# Patient Record
Sex: Female | Born: 1960 | Race: White | Hispanic: No | Marital: Married | Smoking: Never smoker
Health system: Southern US, Community
[De-identification: ages and names within clinical notes are randomized; demographics above are authoritative.]

## PROBLEM LIST (undated history)

## (undated) DIAGNOSIS — Z973 Presence of spectacles and contact lenses: Secondary | ICD-10-CM

## (undated) DIAGNOSIS — Z978 Presence of other specified devices: Secondary | ICD-10-CM

## (undated) DIAGNOSIS — Z960 Presence of urogenital implants: Secondary | ICD-10-CM

## (undated) DIAGNOSIS — N35919 Unspecified urethral stricture, male, unspecified site: Secondary | ICD-10-CM

## (undated) DIAGNOSIS — R310 Gross hematuria: Secondary | ICD-10-CM

## (undated) HISTORY — PX: TUBAL LIGATION: SHX77

## (undated) HISTORY — PX: LAPAROSCOPIC ASSISTED VAGINAL HYSTERECTOMY: SHX5398

## (undated) HISTORY — PX: AUGMENTATION MAMMAPLASTY: SUR837

---

## 1999-09-04 ENCOUNTER — Encounter: Admission: RE | Admit: 1999-09-04 | Discharge: 1999-09-04 | Payer: Self-pay | Admitting: Obstetrics and Gynecology

## 1999-09-04 ENCOUNTER — Encounter: Payer: Self-pay | Admitting: Obstetrics and Gynecology

## 2000-09-11 ENCOUNTER — Encounter: Payer: Self-pay | Admitting: Obstetrics and Gynecology

## 2000-09-11 ENCOUNTER — Encounter: Admission: RE | Admit: 2000-09-11 | Discharge: 2000-09-11 | Payer: Self-pay | Admitting: Obstetrics and Gynecology

## 2001-06-03 HISTORY — PX: BREAST ENHANCEMENT SURGERY: SHX7

## 2001-10-02 ENCOUNTER — Encounter: Admission: RE | Admit: 2001-10-02 | Discharge: 2001-10-02 | Payer: Self-pay | Admitting: Obstetrics and Gynecology

## 2001-10-02 ENCOUNTER — Encounter: Payer: Self-pay | Admitting: Obstetrics and Gynecology

## 2002-10-22 ENCOUNTER — Encounter: Admission: RE | Admit: 2002-10-22 | Discharge: 2002-10-22 | Payer: Self-pay | Admitting: Obstetrics and Gynecology

## 2002-10-22 ENCOUNTER — Encounter: Payer: Self-pay | Admitting: Obstetrics and Gynecology

## 2003-11-02 ENCOUNTER — Encounter: Admission: RE | Admit: 2003-11-02 | Discharge: 2003-11-02 | Payer: Self-pay | Admitting: Obstetrics and Gynecology

## 2004-04-12 ENCOUNTER — Encounter: Admission: RE | Admit: 2004-04-12 | Discharge: 2004-04-12 | Payer: Self-pay | Admitting: Internal Medicine

## 2004-11-07 ENCOUNTER — Encounter: Admission: RE | Admit: 2004-11-07 | Discharge: 2004-11-07 | Payer: Self-pay | Admitting: Obstetrics and Gynecology

## 2005-11-15 ENCOUNTER — Encounter: Admission: RE | Admit: 2005-11-15 | Discharge: 2005-11-15 | Payer: Self-pay | Admitting: Obstetrics and Gynecology

## 2006-11-17 ENCOUNTER — Encounter: Admission: RE | Admit: 2006-11-17 | Discharge: 2006-11-17 | Payer: Self-pay | Admitting: Obstetrics and Gynecology

## 2007-12-01 ENCOUNTER — Encounter: Admission: RE | Admit: 2007-12-01 | Discharge: 2007-12-01 | Payer: Self-pay | Admitting: Obstetrics and Gynecology

## 2008-12-01 ENCOUNTER — Encounter: Admission: RE | Admit: 2008-12-01 | Discharge: 2008-12-01 | Payer: Self-pay | Admitting: Obstetrics and Gynecology

## 2009-04-03 ENCOUNTER — Ambulatory Visit (HOSPITAL_COMMUNITY): Admission: RE | Admit: 2009-04-03 | Discharge: 2009-04-04 | Payer: Self-pay | Admitting: Obstetrics and Gynecology

## 2009-04-03 ENCOUNTER — Encounter (HOSPITAL_COMMUNITY): Payer: Self-pay | Admitting: Obstetrics and Gynecology

## 2009-12-19 ENCOUNTER — Encounter: Admission: RE | Admit: 2009-12-19 | Discharge: 2009-12-19 | Payer: Self-pay | Admitting: Obstetrics and Gynecology

## 2010-06-24 ENCOUNTER — Encounter: Payer: Self-pay | Admitting: Obstetrics and Gynecology

## 2010-09-05 LAB — CBC
Hemoglobin: 8.4 g/dL — ABNORMAL LOW (ref 12.0–15.0)
MCV: 91 fL (ref 78.0–100.0)
Platelets: 123 10*3/uL — ABNORMAL LOW (ref 150–400)
RBC: 2.68 MIL/uL — ABNORMAL LOW (ref 3.87–5.11)
RDW: 13.2 % (ref 11.5–15.5)

## 2010-09-06 LAB — COMPREHENSIVE METABOLIC PANEL
ALT: 12 U/L (ref 0–35)
Alkaline Phosphatase: 49 U/L (ref 39–117)
BUN: 5 mg/dL — ABNORMAL LOW (ref 6–23)
CO2: 26 mEq/L (ref 19–32)
Chloride: 104 mEq/L (ref 96–112)
GFR calc non Af Amer: >60 mL/min (ref >60)
Glucose, Bld: 84 mg/dL (ref 70–99)
Potassium: 3.6 mEq/L (ref 3.5–5.1)
Sodium: 138 mEq/L (ref 135–145)

## 2010-09-06 LAB — CBC
Hemoglobin: 13.4 g/dL (ref 12.0–15.0)
MCHC: 34 g/dL (ref 30.0–36.0)
MCV: 90.8 fL (ref 78.0–100.0)
Platelets: 213 10*3/uL (ref 150–400)
WBC: 5.4 10*3/uL (ref 4.0–10.5)

## 2010-09-06 LAB — TYPE AND SCREEN
ABO/RH(D): O POS
Antibody Screen: NEGATIVE

## 2010-09-06 LAB — ABO/RH: ABO/RH(D): O POS

## 2010-11-21 ENCOUNTER — Other Ambulatory Visit: Payer: Self-pay | Admitting: Obstetrics and Gynecology

## 2010-11-21 DIAGNOSIS — Z1231 Encounter for screening mammogram for malignant neoplasm of breast: Secondary | ICD-10-CM

## 2010-12-25 ENCOUNTER — Ambulatory Visit
Admission: RE | Admit: 2010-12-25 | Discharge: 2010-12-25 | Disposition: A | Discharge status: Home / Self Care | Payer: 59 | Source: Ambulatory Visit | Attending: Obstetrics and Gynecology | Admitting: Obstetrics and Gynecology

## 2010-12-25 DIAGNOSIS — Z1231 Encounter for screening mammogram for malignant neoplasm of breast: Secondary | ICD-10-CM

## 2010-12-28 ENCOUNTER — Other Ambulatory Visit: Payer: Self-pay | Admitting: Obstetrics and Gynecology

## 2010-12-28 DIAGNOSIS — R928 Other abnormal and inconclusive findings on diagnostic imaging of breast: Secondary | ICD-10-CM

## 2011-01-02 ENCOUNTER — Ambulatory Visit
Admission: RE | Admit: 2011-01-02 | Discharge: 2011-01-02 | Disposition: A | Discharge status: Home / Self Care | Payer: 59 | Source: Ambulatory Visit | Attending: Obstetrics and Gynecology | Admitting: Obstetrics and Gynecology

## 2011-01-02 DIAGNOSIS — R928 Other abnormal and inconclusive findings on diagnostic imaging of breast: Secondary | ICD-10-CM

## 2011-04-11 ENCOUNTER — Other Ambulatory Visit: Payer: Self-pay | Admitting: Gastroenterology

## 2011-12-24 ENCOUNTER — Other Ambulatory Visit: Payer: Self-pay | Admitting: Obstetrics and Gynecology

## 2011-12-24 DIAGNOSIS — Z1231 Encounter for screening mammogram for malignant neoplasm of breast: Secondary | ICD-10-CM

## 2011-12-24 DIAGNOSIS — Z9882 Breast implant status: Secondary | ICD-10-CM

## 2012-01-10 ENCOUNTER — Ambulatory Visit
Admission: RE | Admit: 2012-01-10 | Discharge: 2012-01-10 | Disposition: A | Discharge status: Home / Self Care | Payer: 59 | Source: Ambulatory Visit | Attending: Obstetrics and Gynecology | Admitting: Obstetrics and Gynecology

## 2012-01-10 DIAGNOSIS — Z1231 Encounter for screening mammogram for malignant neoplasm of breast: Secondary | ICD-10-CM

## 2012-01-10 DIAGNOSIS — Z9882 Breast implant status: Secondary | ICD-10-CM

## 2012-04-21 ENCOUNTER — Other Ambulatory Visit: Payer: Self-pay | Admitting: Urology

## 2012-04-22 ENCOUNTER — Encounter (HOSPITAL_BASED_OUTPATIENT_CLINIC_OR_DEPARTMENT_OTHER): Payer: Self-pay | Admitting: *Deleted

## 2012-04-22 NOTE — Progress Notes (Signed)
NPO AFTER MN. ARRIVES AT 0745. NEEDS HG. 

## 2012-04-27 NOTE — H&P (Signed)
History of Present Illness          F/u microscopic hematuria referred by Dr. Zelphia Cairo November 2013. The patient was seen for her annual GYN exam August 2013 when a UA showed 1+ blood in the urine culture was negative. She has had no gross hematuria. She had had rare UTI. No recent dysuria.   She voids with a good stream and feels empty. No has no bothersome lower urinary tract symptoms. She has no urgency. No GU surgery. She had TV Hx in 2010 for firboids. No incontinence or pad use.   She has no smoking history. No exposure to chemotherapy or XRT. No chemical, dye, solvent exposure. She has no history of stones.   Interval Hx She returns for CT, exam and cystoscopy.   Unofficially, we discussed her CT scan looks normal.   Past Medical History Problems  1. History of  No Medical Problems  Surgical History Problems  1. History of  Breast Surgery 2. History of  Hysterectomy V45.77 3. History of  Tubal Ligation V25.2  Current Meds 1. Aspirin 81 MG Oral Tablet; Therapy: (Recorded:05Nov2013) to 2. Biotin POWD; Therapy: (Recorded:05Nov2013) to 3. Calcium TABS; Therapy: (Recorded:05Nov2013) to 4. Multi-Vitamin TABS; Therapy: (Recorded:05Nov2013) to  Allergies Medication  1. No Known Drug Allergies  Family History Problems  1. Family history of  Death In The Family Mother 2. Family history of  Family Health Status Number Of Children 1 daughter 3. Family history of  Family Health Status Of Father - Alive 4. Paternal history of  Nephrolithiasis  Social History Problems  1. Alcohol Use occasionally 2. Caffeine Use 2 per day 3. Former Smoker V15.82 4. Occupation: provider claim spec  Review of Systems constitutional Amended By: Jerilee Field; 04/20/2012 4:28 PMEST, cardiovascular Amended By: Jerilee Field; 04/20/2012 4:28 PMEST, pulmonary Amended By: Jerilee Field; 04/20/2012 4:28 PMEST, gastrointestinal Amended By: Jerilee Field; 04/20/2012 4:28  PMEST and neurological Amended By: Jerilee Field; 04/20/2012 4:28 PMEST system(s) were reviewed and pertinent findings if present are noted.    Vitals Vital Signs [Data Includes: Last 1 Day]  18Nov2013 03:25PM  Blood Pressure: 152 / 82 Heart Rate: 76  Physical Exam Constitutional: Well nourished and well developed . No acute distress.  ENT:. The ears and nose are normal in appearance.  Neck: The appearance of the neck is normal and no neck mass is present.  Pulmonary: No respiratory distress and normal respiratory rhythm and effort.  Cardiovascular: Heart rate and rhythm are normal . No peripheral edema.  Abdomen: The abdomen is soft and nontender. No masses are palpated. No CVA tenderness. No hernias are palpable. No hepatosplenomegaly noted.  Genitourinary:  Chaperone Present: .  Examination of the external genitalia shows normal female external genitalia and no lesions. The urethra is normal in appearance and not tender. There is no urethral mass. Vaginal exam demonstrates no abnormalities. The bladder is non tender, not distended and without masses. The anus is normal on inspection. The perineum is normal on inspection.  Lymphatics: The femoral and inguinal nodes are not enlarged or tender.  Skin: Normal skin turgor, no visible rash and no visible skin lesions.  Neuro/Psych:. Mood and affect are appropriate.    Procedure     Patient was placed in lithotomy and prepped and draped in the usual fashion. Alcario Drought was the chaperone.  I could not insert the cystoscope therefore I attempted to dilate the patient with a 39 [place] and a 14 [place] dilator but neither of these would pass  either. The patient was experiencing some discomfort. Therefore the procedure was aborted.     Assessment Assessed  1. Microscopic Hematuria 599.72  Plan Microscopic Hematuria (599.72)  1. AU CT-HEMATURIA PROTOCOL  Done: 18Nov2013 12:00AM 2. Follow-up Schedule Surgery Office  Follow-up  Done:  18Nov2013  Discussion/Summary     Discussed with patient and husband the nature risks benefits and alternatives to cystoscopy possible urethral dilation and possible bladder biopsy in the OR. All questions answered and they elect to proceed. She may simply be tense here in the office as she was quite nervous and not allowing any of the dilators or scope to pass.     Signatures Electronically signed by : Jerilee Field, M.D.; Apr 20 2012  4:28PM

## 2012-04-28 ENCOUNTER — Encounter (HOSPITAL_BASED_OUTPATIENT_CLINIC_OR_DEPARTMENT_OTHER): Payer: Self-pay | Admitting: *Deleted

## 2012-04-28 ENCOUNTER — Encounter (HOSPITAL_BASED_OUTPATIENT_CLINIC_OR_DEPARTMENT_OTHER)
Admission: RE | Disposition: A | Discharge status: Home / Self Care | Payer: Self-pay | Source: Ambulatory Visit | Attending: Urology

## 2012-04-28 ENCOUNTER — Encounter (HOSPITAL_BASED_OUTPATIENT_CLINIC_OR_DEPARTMENT_OTHER): Payer: Self-pay | Admitting: Anesthesiology

## 2012-04-28 ENCOUNTER — Ambulatory Visit (HOSPITAL_BASED_OUTPATIENT_CLINIC_OR_DEPARTMENT_OTHER): Payer: 59 | Admitting: Anesthesiology

## 2012-04-28 ENCOUNTER — Ambulatory Visit (HOSPITAL_BASED_OUTPATIENT_CLINIC_OR_DEPARTMENT_OTHER)
Admission: RE | Admit: 2012-04-28 | Discharge: 2012-04-28 | Disposition: A | Discharge status: Home / Self Care | Payer: 59 | Source: Ambulatory Visit | Attending: Urology | Admitting: Urology

## 2012-04-28 DIAGNOSIS — Z87891 Personal history of nicotine dependence: Secondary | ICD-10-CM | POA: Insufficient documentation

## 2012-04-28 DIAGNOSIS — R3129 Other microscopic hematuria: Secondary | ICD-10-CM | POA: Insufficient documentation

## 2012-04-28 DIAGNOSIS — Z90710 Acquired absence of both cervix and uterus: Secondary | ICD-10-CM | POA: Insufficient documentation

## 2012-04-28 DIAGNOSIS — Z7982 Long term (current) use of aspirin: Secondary | ICD-10-CM | POA: Insufficient documentation

## 2012-04-28 DIAGNOSIS — N35919 Unspecified urethral stricture, male, unspecified site: Secondary | ICD-10-CM | POA: Insufficient documentation

## 2012-04-28 HISTORY — PX: CYSTOSCOPY WITH BIOPSY: SHX5122

## 2012-04-28 HISTORY — PX: CYSTOSCOPY WITH URETHRAL DILATATION: SHX5125

## 2012-04-28 LAB — POCT HEMOGLOBIN-HEMACUE: Hemoglobin: 14.1 g/dL (ref 12.0–15.0)

## 2012-04-28 SURGERY — CYSTOSCOPY, WITH URETHRAL DILATION
Anesthesia: General | Site: Urethra | Wound class: Clean Contaminated

## 2012-04-28 MED ORDER — IOHEXOL 300 MG/ML  SOLN
INTRAMUSCULAR | Status: DC | PRN
  Administered 2012-04-28: 1 mL

## 2012-04-28 MED ORDER — CEFAZOLIN SODIUM-DEXTROSE 2-3 GM-% IV SOLR
2.0000 g | INTRAVENOUS | Status: DC
  Filled 2012-04-28: qty 50

## 2012-04-28 MED ORDER — LACTATED RINGERS IV SOLN
INTRAVENOUS | Status: DC | PRN
  Administered 2012-04-28 (×2): via INTRAVENOUS

## 2012-04-28 MED ORDER — ONDANSETRON HCL 4 MG/2ML IJ SOLN
INTRAMUSCULAR | Status: DC | PRN
  Administered 2012-04-28: 4 mg via INTRAVENOUS

## 2012-04-28 MED ORDER — PROMETHAZINE HCL 25 MG/ML IJ SOLN
6.2500 mg | INTRAMUSCULAR | Status: DC | PRN
  Filled 2012-04-28: qty 1

## 2012-04-28 MED ORDER — DEXAMETHASONE SODIUM PHOSPHATE 4 MG/ML IJ SOLN
INTRAMUSCULAR | Status: DC | PRN
  Administered 2012-04-28: 10 mg via INTRAVENOUS

## 2012-04-28 MED ORDER — CEFAZOLIN SODIUM-DEXTROSE 2-3 GM-% IV SOLR
INTRAVENOUS | Status: DC | PRN
  Administered 2012-04-28: 2 g via INTRAVENOUS

## 2012-04-28 MED ORDER — LACTATED RINGERS IV SOLN
INTRAVENOUS | Status: DC
  Administered 2012-04-28: 100 mL/h via INTRAVENOUS
  Filled 2012-04-28: qty 1000

## 2012-04-28 MED ORDER — MIDAZOLAM HCL 5 MG/5ML IJ SOLN
INTRAMUSCULAR | Status: DC | PRN
  Administered 2012-04-28: 2 mg via INTRAVENOUS

## 2012-04-28 MED ORDER — PROPOFOL 10 MG/ML IV BOLUS
INTRAVENOUS | Status: DC | PRN
  Administered 2012-04-28: 170 mg via INTRAVENOUS

## 2012-04-28 MED ORDER — FENTANYL CITRATE 0.05 MG/ML IJ SOLN
INTRAMUSCULAR | Status: DC | PRN
  Administered 2012-04-28 (×2): 25 ug via INTRAVENOUS
  Administered 2012-04-28: 50 ug via INTRAVENOUS

## 2012-04-28 MED ORDER — STERILE WATER FOR IRRIGATION IR SOLN
Status: DC | PRN
  Administered 2012-04-28: 1500 mL

## 2012-04-28 MED ORDER — LIDOCAINE HCL (CARDIAC) 20 MG/ML IV SOLN
INTRAVENOUS | Status: DC | PRN
  Administered 2012-04-28: 75 mg via INTRAVENOUS

## 2012-04-28 MED ORDER — FENTANYL CITRATE 0.05 MG/ML IJ SOLN
25.0000 ug | INTRAMUSCULAR | Status: DC | PRN
  Filled 2012-04-28: qty 1

## 2012-04-28 MED ORDER — CEFAZOLIN SODIUM 1-5 GM-% IV SOLN
1.0000 g | INTRAVENOUS | Status: DC
  Filled 2012-04-28: qty 50

## 2012-04-28 MED ORDER — KETOROLAC TROMETHAMINE 30 MG/ML IJ SOLN
INTRAMUSCULAR | Status: DC | PRN
  Administered 2012-04-28: 30 mg via INTRAVENOUS

## 2012-04-28 MED ORDER — URIBEL 118 MG PO CAPS: 1.0000 | ORAL_CAPSULE | Freq: Three times a day (TID) | ORAL | Status: AC | PRN

## 2012-04-28 MED ORDER — LACTATED RINGERS IV SOLN
INTRAVENOUS | Status: DC
  Filled 2012-04-28: qty 1000

## 2012-04-28 SURGICAL SUPPLY — 14 items
BAG DRAIN URO-CYSTO SKYTR STRL (DRAIN) ×2 IMPLANT
CANISTER SUCT LVC 12 LTR MEDI- (MISCELLANEOUS) ×2 IMPLANT
CLOTH BEACON ORANGE TIMEOUT ST (SAFETY) ×2 IMPLANT
DRAPE CAMERA CLOSED 9X96 (DRAPES) ×2 IMPLANT
ELECT REM PT RETURN 9FT ADLT (ELECTROSURGICAL) ×2
ELECTRODE REM PT RTRN 9FT ADLT (ELECTROSURGICAL) ×1 IMPLANT
GLOVE BIO SURGEON STRL SZ7.5 (GLOVE) ×2 IMPLANT
GLOVE BIOGEL PI IND STRL 6.5 (GLOVE) ×2 IMPLANT
GLOVE BIOGEL PI INDICATOR 6.5 (GLOVE) ×2
GOWN STRL REIN XL XLG (GOWN DISPOSABLE) ×2 IMPLANT
NEEDLE HYPO 22GX1.5 SAFETY (NEEDLE) IMPLANT
NS IRRIG 500ML POUR BTL (IV SOLUTION) IMPLANT
PACK CYSTOSCOPY (CUSTOM PROCEDURE TRAY) ×2 IMPLANT
WATER STERILE IRR 3000ML UROMA (IV SOLUTION) ×2 IMPLANT

## 2012-04-28 NOTE — Transfer of Care (Signed)
Immediate Anesthesia Transfer of Care Note  Patient: Anna Livingston  Procedure(s) Performed: Procedure(s) (LRB): CYSTOSCOPY WITH URETHRAL DILATATION (N/A) CYSTOSCOPY WITH BIOPSY (N/A)  Patient Location: PACU  Anesthesia Type: General  Level of Consciousness: awake, sedated, patient cooperative and responds to stimulation  Airway & Oxygen Therapy: Patient Spontanous Breathing and Patient connected to face mask oxygen  Post-op Assessment: Report given to PACU RN, Post -op Vital signs reviewed and stable and Patient moving all extremities  Post vital signs: Reviewed and stable  Complications: No apparent anesthesia complications

## 2012-04-28 NOTE — Anesthesia Preprocedure Evaluation (Addendum)
Anesthesia Evaluation  Patient identified by MRN, date of birth, ID band Patient awake    Reviewed: Allergy & Precautions, H&P , NPO status , Patient's Chart, lab work & pertinent test results  Airway Mallampati: II TM Distance: >3 FB Neck ROM: Full    Dental  (+) Teeth Intact, Dental Advisory Given and Caps,    Pulmonary neg pulmonary ROS,  breath sounds clear to auscultation  Pulmonary exam normal       Cardiovascular negative cardio ROS  Rhythm:Regular Rate:Normal     Neuro/Psych negative neurological ROS  negative psych ROS   GI/Hepatic negative GI ROS, Neg liver ROS,   Endo/Other  negative endocrine ROS  Renal/GU negative Renal ROS  negative genitourinary   Musculoskeletal negative musculoskeletal ROS (+)   Abdominal   Peds  Hematology negative hematology ROS (+)   Anesthesia Other Findings   Reproductive/Obstetrics negative OB ROS                          Anesthesia Physical Anesthesia Plan  ASA: I  Anesthesia Plan: General   Post-op Pain Management:    Induction: Intravenous  Airway Management Planned: LMA  Additional Equipment:   Intra-op Plan:   Post-operative Plan: Extubation in OR  Informed Consent: I have reviewed the patients History and Physical, chart, labs and discussed the procedure including the risks, benefits and alternatives for the proposed anesthesia with the patient or authorized representative who has indicated his/her understanding and acceptance.     Plan Discussed with: CRNA  Anesthesia Plan Comments:         Anesthesia Quick Evaluation

## 2012-04-28 NOTE — Interval H&P Note (Signed)
History and Physical Interval Note:  04/28/2012 9:09 AM  Anna Livingston  has presented today for surgery, with the diagnosis of Microhematuria  The various methods of treatment have been discussed with the patient and family. After consideration of risks, benefits and other options for treatment, the patient has consented to  Procedure(s) (LRB) with comments: CYSTOSCOPY WITH URETHRAL DILATATION (N/A) - POSSIBLE URETHRAL DILATION POSSIBLE BLADDER BIOPSY    as a surgical intervention .  The patient's history has been reviewed, patient examined, no change in status, stable for surgery.  I have reviewed the patient's chart and labs.  Questions were answered to the patient's satisfaction.     Antony Haste

## 2012-04-28 NOTE — Anesthesia Procedure Notes (Signed)
Procedure Name: LMA Insertion Date/Time: 04/28/2012 9:15 AM Performed by: Jessica Priest Pre-anesthesia Checklist: Patient identified, Emergency Drugs available, Suction available and Patient being monitored Patient Re-evaluated:Patient Re-evaluated prior to inductionOxygen Delivery Method: Circle System Utilized Preoxygenation: Pre-oxygenation with 100% oxygen Intubation Type: IV induction Ventilation: Mask ventilation without difficulty LMA: LMA inserted LMA Size: 3.0 Number of attempts: 1 Airway Equipment and Method: bite block Placement Confirmation: positive ETCO2 Tube secured with: Tape Dental Injury: Teeth and Oropharynx as per pre-operative assessment

## 2012-04-28 NOTE — Anesthesia Postprocedure Evaluation (Signed)
Anesthesia Post Note  Patient: Anna Livingston  Procedure(s) Performed: Procedure(s) (LRB): CYSTOSCOPY WITH URETHRAL DILATATION (N/A) CYSTOSCOPY WITH BIOPSY (N/A)  Anesthesia type: General  Patient location: PACU  Post pain: Pain level controlled  Post assessment: Post-op Vital signs reviewed  Last Vitals:  Filed Vitals:   04/28/12 1027  BP: 125/81  Pulse:   Temp: 36 C  Resp:     Post vital signs: Reviewed  Level of consciousness: sedated  Complications: No apparent anesthesia complications

## 2012-04-28 NOTE — Op Note (Signed)
Preoperative diagnosis: Microscopic hematuria   Postoperative diagnosis: Microscopic hematuria, urethral stricture  Surgeon: Mena Goes  Anesthesia: Gen.  Findings: On exam under anesthesia the urethra and bladder were palpably normal without mass. The lower abdomen/suprapubic area was palpably normal without mass. On cystoscopy there was white scar tissue along the mid to distal urethra creating a urethral stricture. No diverticulum was palpated or no ostia visualized per urethra. The bladder neck appeared normal. The trigone and ureteral orifice these were normal and orthotopic. There was good clear reflux of urine bilaterally. The bladder mucosa was normal in appearance without mass, foreign body or stone.  Description of procedure: After consent was obtained patient brought to the operating room. A timeout was performed to confirm the patient and procedure. She was prepped and draped in the usual fashion after being placed in lithotomy position. She was examined. The urethral meatus appeared quite tight. It would not accommodate the 22 [place] scope. The urethra was dilated starting a 10 [place] up to 24 [place]. A 17 French rigid cystoscope was passed per urethra and the bladder examined with the 12 and 70 lens. The scope was withdrawn with a 12 lens and the urethra carefully evaluated. The scope was removed. The 24 [place] dilator was passed again to drain the bladder. Lidocaine jelly was injected per urethra. The patient was awakened and taken to cover in stable condition.  Specimens: None  Complications: None  Blood loss: Minimal  Drains: None  Disposition: Patient stable to PACU.

## 2012-04-29 ENCOUNTER — Encounter (HOSPITAL_BASED_OUTPATIENT_CLINIC_OR_DEPARTMENT_OTHER): Payer: Self-pay | Admitting: Urology

## 2012-12-15 ENCOUNTER — Other Ambulatory Visit: Payer: Self-pay

## 2012-12-15 DIAGNOSIS — Z1231 Encounter for screening mammogram for malignant neoplasm of breast: Secondary | ICD-10-CM

## 2013-01-11 ENCOUNTER — Ambulatory Visit
Admission: RE | Admit: 2013-01-11 | Discharge: 2013-01-11 | Disposition: A | Discharge status: Home / Self Care | Payer: 59 | Source: Ambulatory Visit

## 2013-01-11 DIAGNOSIS — Z1231 Encounter for screening mammogram for malignant neoplasm of breast: Secondary | ICD-10-CM

## 2013-04-08 ENCOUNTER — Other Ambulatory Visit: Payer: Self-pay

## 2013-04-23 ENCOUNTER — Other Ambulatory Visit: Payer: Self-pay | Admitting: Urology

## 2013-04-26 ENCOUNTER — Encounter (HOSPITAL_BASED_OUTPATIENT_CLINIC_OR_DEPARTMENT_OTHER): Payer: Self-pay | Admitting: *Deleted

## 2013-04-26 NOTE — Progress Notes (Signed)
NPO AFTER MN. ARRIVE AT 0900. NEEDS HG. WILL TAKE TYLENOL AND ESTRADIOL AM DOS W/ SIPS OF WATER.

## 2013-04-26 NOTE — H&P (Signed)
Reason For Visit urinary retention   History of Present Illness Anna Livingston is a 52 yr old female patient of Dr. Estil Daft w/ GU hx of gross hematuria and incidental finding of urethral stricture when cystoscopy was attempted as part of hematuria workup in November 2013. She was then brought to the OR for cysto urethral dilation and no significant finding of source of hematuria at that time.    Interval hx:  Anna Livingston returns today w/ c/o gradual worsening of urinary dribbling which was first noted on Friday 6 days ago. Dribbling seemed to resolve during the week but around 11 am today she experienced complete cessation of urine flow and unable to void at all. Also during this week, she has noted several episodes of gross hematuria during wiping but not in the toilet when she voids. She has been drinking cranberry juice and increase hydration during the week. Denies fever/chills/nausea/vomiting. Denies any other associated aggravating/alleviating factors.     Past Medical History Problems  1. History of No Medical Problems  Surgical History Problems  1. History of Breast Surgery 2. History of Cystoscopy For Urethral Stricture 3. History of Hysterectomy 4. History of Tubal Ligation  Current Meds 1. Aspirin 81 MG Oral Tablet;  Therapy: (Recorded:05Nov2013) to Recorded 2. Biotin POWD;  Therapy: (Recorded:05Nov2013) to Recorded 3. Calcium TABS;  Therapy: (Recorded:05Nov2013) to Recorded 4. Estradiol 0.5 MG Oral Tablet;  Therapy: (Recorded:20Nov2014) to Recorded 5. Multi-Vitamin TABS;  Therapy: (Recorded:05Nov2013) to Recorded  Allergies Medication  1. No Known Drug Allergies  Family History Problems  1. Family history of Death In The Family Mother 2. Family history of Family Health Status Number Of Children   1 daughter 3. Family history of Family Health Status Of Father - Alive 4. Family history of Nephrolithiasis : Father  Social History Problems  1. Alcohol Use   occasionally 2. Caffeine Use   2 per day 3. Former smoker (V15.82) 4. Occupation:   provider claim spec  Review of Systems Genitourinary, constitutional, skin, eye, otolaryngeal, hematologic/lymphatic, cardiovascular, pulmonary, endocrine, musculoskeletal, gastrointestinal, neurological and psychiatric system(s) were reviewed and pertinent findings if present are noted.  Genitourinary: urinary stream starts and stops, incomplete emptying of bladder, hematuria, suprapubic pain and initiating urination requires straining, but no urinary urgency, no dysuria, no nocturia and urine not cloudy.  Gastrointestinal: abdominal pain, but no nausea, no vomiting, no flank pain, no diarrhea, no constipation and no melena.  Constitutional: no fever.    Vitals Vital Signs [Data Includes: Last 1 Day]  Recorded: 20Nov2014 03:27PM  Blood Pressure: 148 / 92 Temperature: 97.9 F Heart Rate: 94  Physical Exam Constitutional: Well nourished and well developed . No acute distress.  Pulmonary: No respiratory distress.  Cardiovascular:. The arterial pulses are normal.  Abdomen: The abdomen is soft and nontender.    Results/Data Urine [Data Includes: Last 1 Day]   20Nov2014  COLOR YELLOW   APPEARANCE CLEAR   SPECIFIC GRAVITY 1.010   pH 6.5   GLUCOSE NEG mg/dL  BILIRUBIN NEG   KETONE NEG mg/dL  BLOOD MOD   PROTEIN NEG mg/dL  UROBILINOGEN 0.2 mg/dL  NITRITE NEG   LEUKOCYTE ESTERASE NEG   SQUAMOUS EPITHELIAL/HPF NONE SEEN   WBC NONE SEEN WBC/hpf  RBC 3-6 RBC/hpf  BACTERIA MANY   CRYSTALS NONE SEEN   CASTS NONE SEEN    The following images/tracing/specimen were independently visualized:  PVR = .  The following clinical lab reports were reviewed:  Cath UA = + 3-6 RBC, many bacteria,  but no WBC.    Procedure Nursing staff unable to insert 16 fr foley catheter due to stricture but able to insert a 14 Fr foley catheter w/ large amount of lubrication. Drained of clear urine. Patient  expressed relief after bladder drainage.     Assessment Assessed  1. Urethral stricture (598.9) 2. Urinary tract infection (599.0) 3. Dysuria (788.1)  Recurrence of urethral stricture. UA + RBC and +bacteria.   Plan Dysuria  1. Start: Phenazopyridine HCl - 200 MG Oral Tablet; TAKE 1 TABLET 3 times daily PRN Health Maintenance  2. UA With REFLEX; Status:Complete;   Done: 20Nov2014 04:06PM Microscopic hematuria  3. PVR U/S; Status:Complete;   Done: 20Nov2014 Urethral stricture  4. Follow-up Day x 3 Office  Follow-up w dr Mena Goes on Monday  Status: Hold For - Date of  Service  Requested for: 24Nov2014 Urinary tract infection  5. Start: Ciprofloxacin HCl - 500 MG Oral Tablet; Take 1 tablet twice daily  Discussion/Summary - Discussed w/ patient and husband that urinary retention is most likely caused by recurrence of urethral stricture. In addition hematuria on tissue w/ wiping is also concerning for abnormal lower GU tract. Therefore, she will need the procedure cystoscopy to eval both stricture and hematuria.  Patient will go home w/ Foley catheter today and will be contacted by OR scheduler for cysto urethral dilation in the OR (confirmed w/ Dr. Mena Goes). Patient verbalized understanding and is agreeable to plan.  - Cipro 500mg  one po bid x 5 days to cover for today's cath insertion procedure and cath UA + bacteria and RBC.  - Pyridium for urethral pain w/ the catheter.  - Hydrate 2L water daily. Stop cranberry juice and all other citric based fluid and diet to prevent further irritation of bladder whichh now has an indwelling catheter.   Signatures Electronically signed by : Seward Grater, ANP-C; Apr 22 2013  5:51PM EST   Add: Urine Cx negative. I discussed patient with NP [name].

## 2013-04-27 ENCOUNTER — Encounter (HOSPITAL_BASED_OUTPATIENT_CLINIC_OR_DEPARTMENT_OTHER)
Admission: RE | Disposition: A | Discharge status: Home / Self Care | Payer: Self-pay | Source: Ambulatory Visit | Attending: Urology

## 2013-04-27 ENCOUNTER — Encounter (HOSPITAL_BASED_OUTPATIENT_CLINIC_OR_DEPARTMENT_OTHER): Payer: 59 | Admitting: Anesthesiology

## 2013-04-27 ENCOUNTER — Ambulatory Visit (HOSPITAL_BASED_OUTPATIENT_CLINIC_OR_DEPARTMENT_OTHER)
Admission: RE | Admit: 2013-04-27 | Discharge: 2013-04-27 | Disposition: A | Discharge status: Home / Self Care | Payer: 59 | Source: Ambulatory Visit | Attending: Urology | Admitting: Urology

## 2013-04-27 ENCOUNTER — Ambulatory Visit (HOSPITAL_BASED_OUTPATIENT_CLINIC_OR_DEPARTMENT_OTHER): Payer: 59 | Admitting: Anesthesiology

## 2013-04-27 ENCOUNTER — Encounter (HOSPITAL_BASED_OUTPATIENT_CLINIC_OR_DEPARTMENT_OTHER): Payer: Self-pay | Admitting: *Deleted

## 2013-04-27 DIAGNOSIS — N3020 Other chronic cystitis without hematuria: Secondary | ICD-10-CM | POA: Insufficient documentation

## 2013-04-27 DIAGNOSIS — Z90710 Acquired absence of both cervix and uterus: Secondary | ICD-10-CM | POA: Insufficient documentation

## 2013-04-27 DIAGNOSIS — R339 Retention of urine, unspecified: Secondary | ICD-10-CM | POA: Insufficient documentation

## 2013-04-27 DIAGNOSIS — Z79899 Other long term (current) drug therapy: Secondary | ICD-10-CM | POA: Insufficient documentation

## 2013-04-27 DIAGNOSIS — Z7982 Long term (current) use of aspirin: Secondary | ICD-10-CM | POA: Insufficient documentation

## 2013-04-27 DIAGNOSIS — N35919 Unspecified urethral stricture, male, unspecified site: Secondary | ICD-10-CM | POA: Insufficient documentation

## 2013-04-27 DIAGNOSIS — Z87891 Personal history of nicotine dependence: Secondary | ICD-10-CM | POA: Insufficient documentation

## 2013-04-27 HISTORY — DX: Presence of urogenital implants: Z96.0

## 2013-04-27 HISTORY — DX: Gross hematuria: R31.0

## 2013-04-27 HISTORY — PX: CYSTOSCOPY WITH URETHRAL DILATATION: SHX5125

## 2013-04-27 HISTORY — DX: Presence of spectacles and contact lenses: Z97.3

## 2013-04-27 HISTORY — DX: Unspecified urethral stricture, male, unspecified site: N35.919

## 2013-04-27 HISTORY — DX: Presence of other specified devices: Z97.8

## 2013-04-27 LAB — POCT HEMOGLOBIN-HEMACUE: Hemoglobin: 13.6 g/dL (ref 12.0–15.0)

## 2013-04-27 SURGERY — CYSTOSCOPY, WITH URETHRAL DILATION
Anesthesia: General | Site: Bladder | Wound class: Clean Contaminated

## 2013-04-27 MED ORDER — FENTANYL CITRATE 0.05 MG/ML IJ SOLN
INTRAMUSCULAR | Status: AC
  Filled 2013-04-27: qty 4

## 2013-04-27 MED ORDER — MIDAZOLAM HCL 2 MG/2ML IJ SOLN
INTRAMUSCULAR | Status: AC
  Filled 2013-04-27: qty 2

## 2013-04-27 MED ORDER — MIDAZOLAM HCL 5 MG/5ML IJ SOLN
INTRAMUSCULAR | Status: DC | PRN
  Administered 2013-04-27: 2 mg via INTRAVENOUS

## 2013-04-27 MED ORDER — LACTATED RINGERS IV SOLN
INTRAVENOUS | Status: DC
  Administered 2013-04-27: 10:00:00 via INTRAVENOUS
  Filled 2013-04-27: qty 1000

## 2013-04-27 MED ORDER — FENTANYL CITRATE 0.05 MG/ML IJ SOLN
25.0000 ug | INTRAMUSCULAR | Status: DC | PRN
  Filled 2013-04-27: qty 1

## 2013-04-27 MED ORDER — PROPOFOL 10 MG/ML IV BOLUS
INTRAVENOUS | Status: DC | PRN
  Administered 2013-04-27: 50 mg via INTRAVENOUS
  Administered 2013-04-27: 150 mg via INTRAVENOUS

## 2013-04-27 MED ORDER — CEFAZOLIN SODIUM-DEXTROSE 2-3 GM-% IV SOLR
INTRAVENOUS | Status: AC
  Filled 2013-04-27: qty 50

## 2013-04-27 MED ORDER — STERILE WATER FOR IRRIGATION IR SOLN
Status: DC | PRN
  Administered 2013-04-27: 3000 mL via INTRAVESICAL

## 2013-04-27 MED ORDER — FENTANYL CITRATE 0.05 MG/ML IJ SOLN
INTRAMUSCULAR | Status: DC | PRN
  Administered 2013-04-27: 50 ug via INTRAVENOUS

## 2013-04-27 MED ORDER — DEXAMETHASONE SODIUM PHOSPHATE 4 MG/ML IJ SOLN
INTRAMUSCULAR | Status: DC | PRN
  Administered 2013-04-27: 10 mg via INTRAVENOUS

## 2013-04-27 MED ORDER — PROMETHAZINE HCL 25 MG/ML IJ SOLN
6.2500 mg | INTRAMUSCULAR | Status: DC | PRN
  Filled 2013-04-27: qty 1

## 2013-04-27 MED ORDER — CEFAZOLIN SODIUM 1-5 GM-% IV SOLN
1.0000 g | INTRAVENOUS | Status: DC
  Filled 2013-04-27: qty 50

## 2013-04-27 MED ORDER — ONDANSETRON HCL 4 MG/2ML IJ SOLN
INTRAMUSCULAR | Status: DC | PRN
  Administered 2013-04-27: 4 mg via INTRAVENOUS

## 2013-04-27 MED ORDER — LIDOCAINE HCL (CARDIAC) 20 MG/ML IV SOLN
INTRAVENOUS | Status: DC | PRN
  Administered 2013-04-27: 100 mg via INTRAVENOUS

## 2013-04-27 MED ORDER — CEFAZOLIN SODIUM-DEXTROSE 2-3 GM-% IV SOLR
2.0000 g | INTRAVENOUS | Status: AC
  Administered 2013-04-27: 2 g via INTRAVENOUS
  Filled 2013-04-27: qty 50

## 2013-04-27 MED ORDER — KETOROLAC TROMETHAMINE 30 MG/ML IJ SOLN
INTRAMUSCULAR | Status: DC | PRN
  Administered 2013-04-27: 30 mg via INTRAVENOUS

## 2013-04-27 SURGICAL SUPPLY — 19 items
BAG DRAIN URO-CYSTO SKYTR STRL (DRAIN) ×2 IMPLANT
BALLN NEPHROSTOMY (BALLOONS)
BALLOON NEPHROSTOMY (BALLOONS) IMPLANT
CANISTER SUCT LVC 12 LTR MEDI- (MISCELLANEOUS) IMPLANT
CLOTH BEACON ORANGE TIMEOUT ST (SAFETY) ×2 IMPLANT
DRAPE CAMERA CLOSED 9X96 (DRAPES) ×2 IMPLANT
ELECT REM PT RETURN 9FT ADLT (ELECTROSURGICAL)
ELECTRODE REM PT RTRN 9FT ADLT (ELECTROSURGICAL) IMPLANT
GLOVE BIO SURGEON STRL SZ 6.5 (GLOVE) ×2 IMPLANT
GLOVE BIO SURGEON STRL SZ7.5 (GLOVE) ×2 IMPLANT
GLOVE INDICATOR 6.5 STRL GRN (GLOVE) ×2 IMPLANT
GOWN PREVENTION PLUS LG XLONG (DISPOSABLE) ×2 IMPLANT
GOWN STRL NON-REIN LRG LVL3 (GOWN DISPOSABLE) ×4 IMPLANT
GOWN STRL REIN XL XLG (GOWN DISPOSABLE) ×2 IMPLANT
GUIDEWIRE 0.038 PTFE COATED (WIRE) ×2 IMPLANT
GUIDEWIRE ANG ZIPWIRE 038X150 (WIRE) IMPLANT
GUIDEWIRE STR DUAL SENSOR (WIRE) IMPLANT
PACK CYSTOSCOPY (CUSTOM PROCEDURE TRAY) ×2 IMPLANT
SYRINGE IRR TOOMEY STRL 70CC (SYRINGE) IMPLANT

## 2013-04-27 NOTE — Anesthesia Procedure Notes (Signed)
Procedure Name: LMA Insertion Date/Time: 04/27/2013 10:45 AM Performed by: Norva Pavlov Pre-anesthesia Checklist: Patient identified, Emergency Drugs available, Suction available and Patient being monitored Patient Re-evaluated:Patient Re-evaluated prior to inductionOxygen Delivery Method: Circle System Utilized Preoxygenation: Pre-oxygenation with 100% oxygen Intubation Type: IV induction Ventilation: Mask ventilation without difficulty LMA: LMA inserted LMA Size: 3.0 Number of attempts: 1 Airway Equipment and Method: bite block Placement Confirmation: positive ETCO2 Tube secured with: Tape Dental Injury: Teeth and Oropharynx as per pre-operative assessment

## 2013-04-27 NOTE — Interval H&P Note (Signed)
History and Physical Interval Note:  04/27/2013 9:35 AM  Anna Livingston  has presented today for surgery, with the diagnosis of URETERAL STRICTURE/GROSS HEMATURIA  The various methods of treatment have been discussed with the patient and family. After consideration of risks, benefits and other options for treatment, the patient has consented to  Procedure(s): CYSTOSCOPY WITH URETHRAL DILATATION, POSSIBLE BIOPSY (N/A) as a surgical intervention . Per pt, about 2 weeks ago she developed urinary hesitancy and weak stream. Then five days ago it recurred with hematuria and she could not void. A catheter was attempted in the office and would not pass and she had pain, therefore a smaller catheter was used.  The patient's history has been reviewed, patient examined, no change in status, stable for surgery.  I have reviewed the patient's chart and labs.  Questions were answered to the patient's satisfaction.  We discussed leaving foley for a few days but she wants it out. They are traveling. Also we discussed her learning CIC as success rates with dilation are high but recurrence rates are also high. She declined. She feels well and has had no fever or chills. Urine is clear.   Antony Haste

## 2013-04-27 NOTE — Transfer of Care (Signed)
Immediate Anesthesia Transfer of Care Note  Patient: Anna Livingston  Procedure(s) Performed: Procedure(s) (LRB): CYSTOSCOPY WITH URETHRAL DILATATION, POSSIBLE BIOPSY (N/A)  Patient Location: PACU  Anesthesia Type: General  Level of Consciousness: awake, alert  and oriented  Airway & Oxygen Therapy: Patient Spontanous Breathing and Patient connected to face mask oxygen  Post-op Assessment: Report given to PACU RN and Post -op Vital signs reviewed and stable  Post vital signs: Reviewed and stable  Complications: No apparent anesthesia complications

## 2013-04-27 NOTE — Op Note (Signed)
Preoperative diagnosis: Urethral stricture, urinary retention Postoperative diagnosis: Urethral stricture, urinary retention  Procedure: Exam under anesthesia Cystoscopy Bladder biopsy fulguration Urethral dilation  Surgeon: Mena Goes  Anesthesia: Gen.   findings: On exam under anesthesia the bladder and urethra were palpably normal without induration or mass. Cystoscopy - the urethra was patent with some pale white tissue. There were no suspicious tumors or neoplastic-appearing areas. The trigone and ureteral orifices were in their normal orthotopic position and there was good orange efflux bilaterally. The bladder contained no stone or foreign body. The mucosa contained some scattered erythematous patches and a representative portion was biopsied and photographed on the right posterior. In the posterior bladder there was a papillary appearing area likely from a Foley catheter in this area was biopsied.    Description of procedure: After consent was obtained patient was brought to the operating room. After adequate anesthesia she was placed in lithotomy position and prepped and draped in the usual sterile fashion. A timeout was performed to confirm the patient and procedure. An exam under anesthesia was performed. A cystoscope was passed per urethra and it went in without difficulty but the patient reacted with increased breathing indicating it may been quite tight and painful. The bladder was inspected. The right posterior was biopsied. The posterior was biopsied. Biopsy sites were fulgurated and there was excellent hemostasis and a low pressure. 150 mL was left in the bladder.  The scope was removed and the urethra dilated to 97 [place] without difficulty. Lidocaine jelly was inserted per urethra. The patient was awakened and taken to recovery room in stable condition.  Complications: None Drains: None Blood loss: Minimal  Specimens: #1 right posterior bladder biopsy - representative of  some scattered erythematous patches #2 posterior bladder biopsy bullous edema likely from catheter  Disposition: Patient stable to PACU.

## 2013-04-27 NOTE — Anesthesia Preprocedure Evaluation (Signed)

## 2013-04-27 NOTE — Anesthesia Postprocedure Evaluation (Signed)
  Anesthesia Post-op Note  Patient: Anna Livingston  Procedure(s) Performed: Procedure(s) (LRB): CYSTOSCOPY WITH URETHRAL DILATATION, POSSIBLE BIOPSY (N/A)  Patient Location: PACU  Anesthesia Type: General  Level of Consciousness: awake and alert   Airway and Oxygen Therapy: Patient Spontanous Breathing  Post-op Pain: mild  Post-op Assessment: Post-op Vital signs reviewed, Patient's Cardiovascular Status Stable, Respiratory Function Stable, Patent Airway and No signs of Nausea or vomiting  Last Vitals:  Filed Vitals:   04/27/13 1300  BP: 130/81  Pulse: 72  Temp: 36.4 C  Resp: 17    Post-op Vital Signs: stable   Complications: No apparent anesthesia complications

## 2013-04-28 ENCOUNTER — Encounter (HOSPITAL_BASED_OUTPATIENT_CLINIC_OR_DEPARTMENT_OTHER): Payer: Self-pay | Admitting: Urology

## 2013-12-14 ENCOUNTER — Other Ambulatory Visit: Payer: Self-pay

## 2013-12-14 DIAGNOSIS — Z9882 Breast implant status: Secondary | ICD-10-CM

## 2013-12-14 DIAGNOSIS — Z1231 Encounter for screening mammogram for malignant neoplasm of breast: Secondary | ICD-10-CM

## 2014-01-12 ENCOUNTER — Ambulatory Visit
Admission: RE | Admit: 2014-01-12 | Discharge: 2014-01-12 | Disposition: A | Discharge status: Home / Self Care | Payer: 59 | Source: Ambulatory Visit

## 2014-01-12 DIAGNOSIS — Z1231 Encounter for screening mammogram for malignant neoplasm of breast: Secondary | ICD-10-CM

## 2014-01-12 DIAGNOSIS — Z9882 Breast implant status: Secondary | ICD-10-CM

## 2014-02-23 ENCOUNTER — Other Ambulatory Visit: Payer: Self-pay | Admitting: Obstetrics and Gynecology

## 2014-02-24 LAB — CYTOLOGY - PAP

## 2014-12-14 ENCOUNTER — Other Ambulatory Visit: Payer: Self-pay

## 2014-12-14 DIAGNOSIS — Z1231 Encounter for screening mammogram for malignant neoplasm of breast: Secondary | ICD-10-CM

## 2015-01-20 ENCOUNTER — Ambulatory Visit
Admission: RE | Admit: 2015-01-20 | Discharge: 2015-01-20 | Disposition: A | Discharge status: Home / Self Care | Payer: 59 | Source: Ambulatory Visit

## 2015-01-20 DIAGNOSIS — Z1231 Encounter for screening mammogram for malignant neoplasm of breast: Secondary | ICD-10-CM

## 2015-03-01 ENCOUNTER — Other Ambulatory Visit: Payer: Self-pay | Admitting: Obstetrics and Gynecology

## 2015-03-02 LAB — CYTOLOGY - PAP

## 2015-12-19 ENCOUNTER — Other Ambulatory Visit: Payer: Self-pay | Admitting: Obstetrics and Gynecology

## 2015-12-19 DIAGNOSIS — Z1231 Encounter for screening mammogram for malignant neoplasm of breast: Secondary | ICD-10-CM

## 2016-01-22 ENCOUNTER — Ambulatory Visit
Admission: RE | Admit: 2016-01-22 | Discharge: 2016-01-22 | Disposition: A | Discharge status: Home / Self Care | Payer: 59 | Source: Ambulatory Visit | Attending: Obstetrics and Gynecology | Admitting: Obstetrics and Gynecology

## 2016-01-22 DIAGNOSIS — Z1231 Encounter for screening mammogram for malignant neoplasm of breast: Secondary | ICD-10-CM

## 2016-10-02 DIAGNOSIS — R3982 Chronic bladder pain: Secondary | ICD-10-CM | POA: Diagnosis not present

## 2016-10-09 DIAGNOSIS — R3982 Chronic bladder pain: Secondary | ICD-10-CM | POA: Diagnosis not present

## 2016-11-18 DIAGNOSIS — Z0000 Encounter for general adult medical examination without abnormal findings: Secondary | ICD-10-CM | POA: Diagnosis not present

## 2016-12-31 ENCOUNTER — Other Ambulatory Visit: Payer: Self-pay | Admitting: Obstetrics and Gynecology

## 2016-12-31 DIAGNOSIS — Z1231 Encounter for screening mammogram for malignant neoplasm of breast: Secondary | ICD-10-CM

## 2017-01-22 ENCOUNTER — Ambulatory Visit
Admission: RE | Admit: 2017-01-22 | Discharge: 2017-01-22 | Disposition: A | Discharge status: Home / Self Care | Payer: 59 | Source: Ambulatory Visit | Attending: Obstetrics and Gynecology | Admitting: Obstetrics and Gynecology

## 2017-01-22 DIAGNOSIS — Z1231 Encounter for screening mammogram for malignant neoplasm of breast: Secondary | ICD-10-CM | POA: Diagnosis not present

## 2017-03-08 DIAGNOSIS — Z23 Encounter for immunization: Secondary | ICD-10-CM | POA: Diagnosis not present

## 2017-03-18 DIAGNOSIS — Z13820 Encounter for screening for osteoporosis: Secondary | ICD-10-CM | POA: Diagnosis not present

## 2017-04-10 DIAGNOSIS — Z6822 Body mass index (BMI) 22.0-22.9, adult: Secondary | ICD-10-CM | POA: Diagnosis not present

## 2017-04-10 DIAGNOSIS — Z01419 Encounter for gynecological examination (general) (routine) without abnormal findings: Secondary | ICD-10-CM | POA: Diagnosis not present

## 2017-06-24 DIAGNOSIS — R3911 Hesitancy of micturition: Secondary | ICD-10-CM | POA: Diagnosis not present

## 2017-08-10 DIAGNOSIS — Z23 Encounter for immunization: Secondary | ICD-10-CM | POA: Diagnosis not present

## 2017-09-03 DIAGNOSIS — N951 Menopausal and female climacteric states: Secondary | ICD-10-CM | POA: Diagnosis not present

## 2017-09-03 DIAGNOSIS — R454 Irritability and anger: Secondary | ICD-10-CM | POA: Diagnosis not present

## 2017-10-11 DIAGNOSIS — Z23 Encounter for immunization: Secondary | ICD-10-CM | POA: Diagnosis not present

## 2017-11-19 DIAGNOSIS — R3982 Chronic bladder pain: Secondary | ICD-10-CM | POA: Diagnosis not present

## 2017-12-22 ENCOUNTER — Other Ambulatory Visit: Payer: Self-pay | Admitting: Obstetrics and Gynecology

## 2017-12-22 DIAGNOSIS — Z1231 Encounter for screening mammogram for malignant neoplasm of breast: Secondary | ICD-10-CM

## 2018-01-23 ENCOUNTER — Ambulatory Visit
Admission: RE | Admit: 2018-01-23 | Discharge: 2018-01-23 | Disposition: A | Discharge status: Home / Self Care | Payer: 59 | Source: Ambulatory Visit | Attending: Obstetrics and Gynecology | Admitting: Obstetrics and Gynecology

## 2018-01-23 DIAGNOSIS — Z1231 Encounter for screening mammogram for malignant neoplasm of breast: Secondary | ICD-10-CM | POA: Diagnosis not present

## 2018-02-04 DIAGNOSIS — R3911 Hesitancy of micturition: Secondary | ICD-10-CM | POA: Diagnosis not present

## 2018-02-11 DIAGNOSIS — R338 Other retention of urine: Secondary | ICD-10-CM | POA: Diagnosis not present

## 2018-02-26 DIAGNOSIS — Z23 Encounter for immunization: Secondary | ICD-10-CM | POA: Diagnosis not present

## 2018-04-20 DIAGNOSIS — Z01419 Encounter for gynecological examination (general) (routine) without abnormal findings: Secondary | ICD-10-CM | POA: Diagnosis not present

## 2018-04-20 DIAGNOSIS — Z681 Body mass index (BMI) 19 or less, adult: Secondary | ICD-10-CM | POA: Diagnosis not present

## 2018-04-27 DIAGNOSIS — R338 Other retention of urine: Secondary | ICD-10-CM | POA: Diagnosis not present

## 2018-06-09 DIAGNOSIS — D224 Melanocytic nevi of scalp and neck: Secondary | ICD-10-CM | POA: Diagnosis not present

## 2018-06-09 DIAGNOSIS — D225 Melanocytic nevi of trunk: Secondary | ICD-10-CM | POA: Diagnosis not present

## 2018-06-09 DIAGNOSIS — D2371 Other benign neoplasm of skin of right lower limb, including hip: Secondary | ICD-10-CM | POA: Diagnosis not present

## 2018-06-16 DIAGNOSIS — N3582 Other urethral stricture, female: Secondary | ICD-10-CM | POA: Diagnosis not present

## 2018-10-27 DIAGNOSIS — M25561 Pain in right knee: Secondary | ICD-10-CM | POA: Diagnosis not present

## 2018-10-27 DIAGNOSIS — S82141A Displaced bicondylar fracture of right tibia, initial encounter for closed fracture: Secondary | ICD-10-CM | POA: Diagnosis not present

## 2019-01-11 ENCOUNTER — Other Ambulatory Visit: Payer: Self-pay | Admitting: Obstetrics and Gynecology

## 2019-01-11 DIAGNOSIS — Z1231 Encounter for screening mammogram for malignant neoplasm of breast: Secondary | ICD-10-CM

## 2019-02-25 ENCOUNTER — Other Ambulatory Visit: Payer: Self-pay

## 2019-02-25 ENCOUNTER — Ambulatory Visit
Admission: RE | Admit: 2019-02-25 | Discharge: 2019-02-25 | Disposition: A | Discharge status: Home / Self Care | Payer: 59 | Source: Ambulatory Visit | Attending: Obstetrics and Gynecology | Admitting: Obstetrics and Gynecology

## 2019-02-25 DIAGNOSIS — Z1231 Encounter for screening mammogram for malignant neoplasm of breast: Secondary | ICD-10-CM

## 2020-02-17 IMAGING — MG DIGITAL SCREENING BILATERAL MAMMOGRAM WITH IMPLANTS, CAD AND TOM
8 of 16 series · 8 of 40 positions shown · non-contrast
Comparison: Previous exam(s).

CLINICAL DATA: Screening.

EXAM:
DIGITAL SCREENING BILATERAL MAMMOGRAM WITH IMPLANTS, CAD AND TOMO
The patient has retropectoral implants. Standard and implant
displaced views were performed.

[R CC]
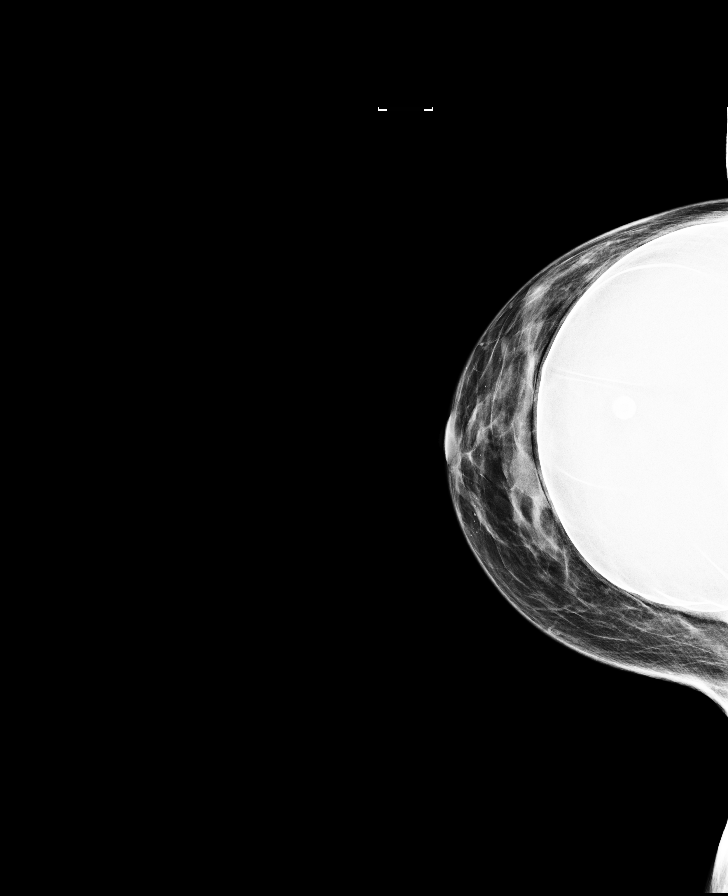

[R MLO]
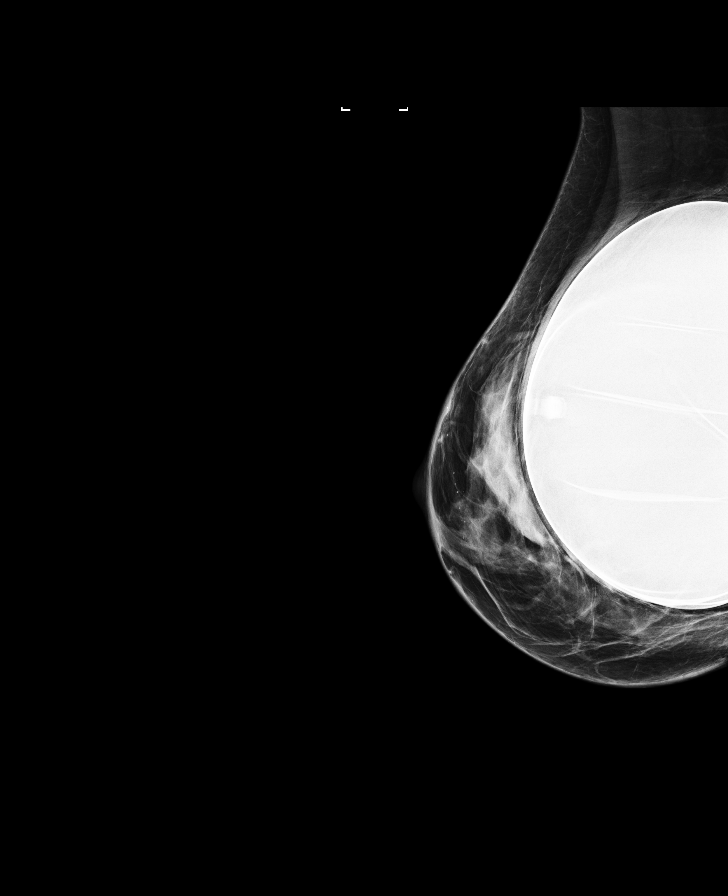

[L MLO]
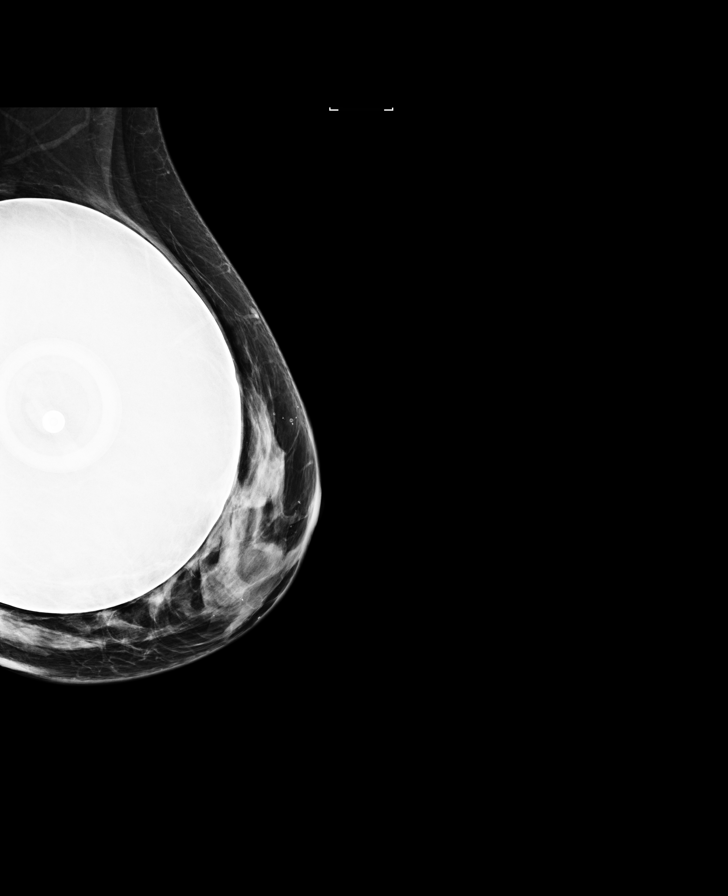

[L CC]
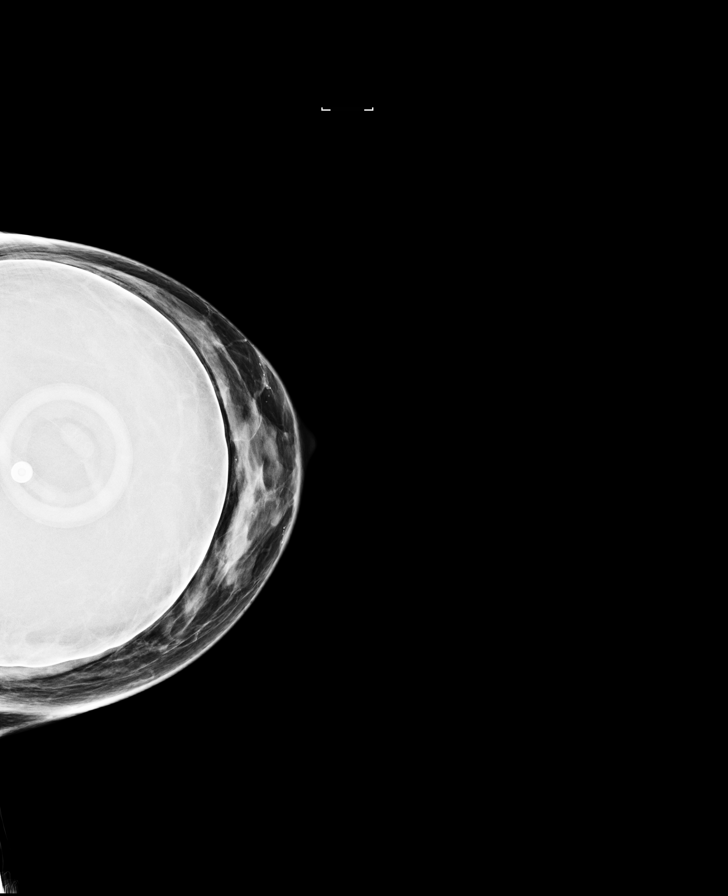

[R CC synth-2D]
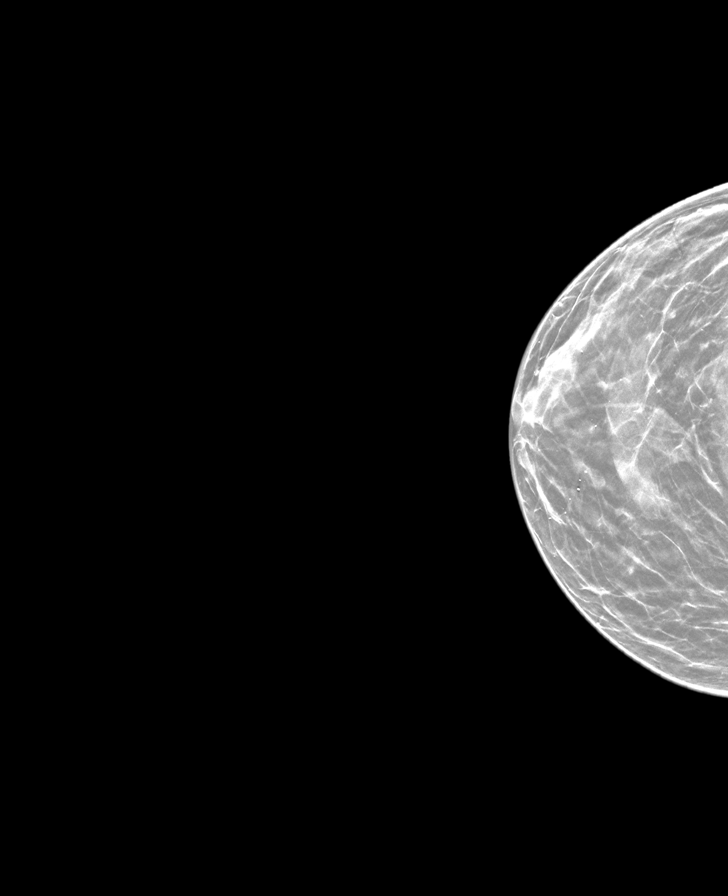

[L CC synth-2D]
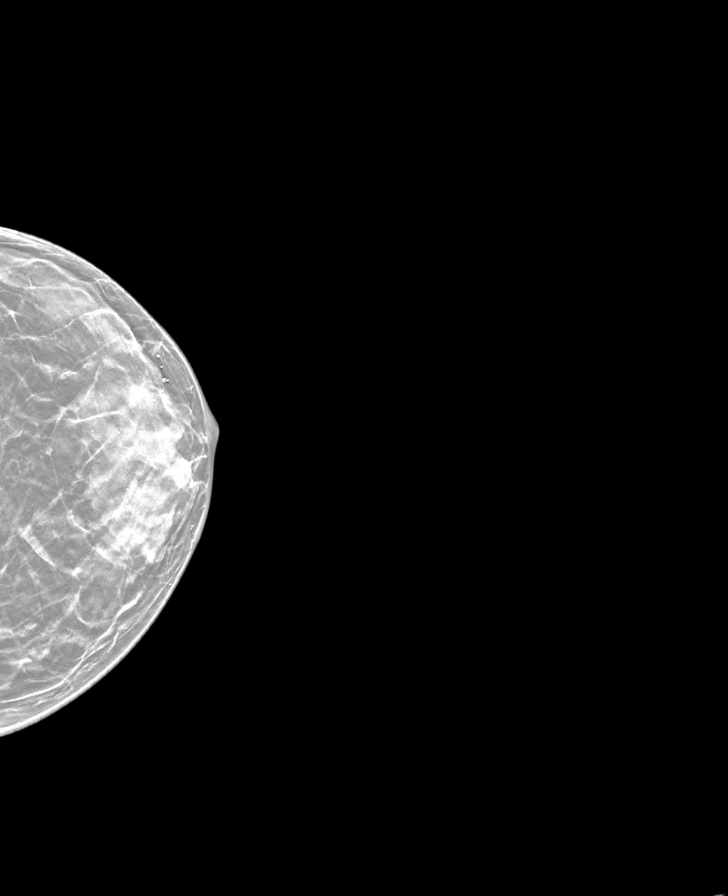

[R MLO synth-2D]
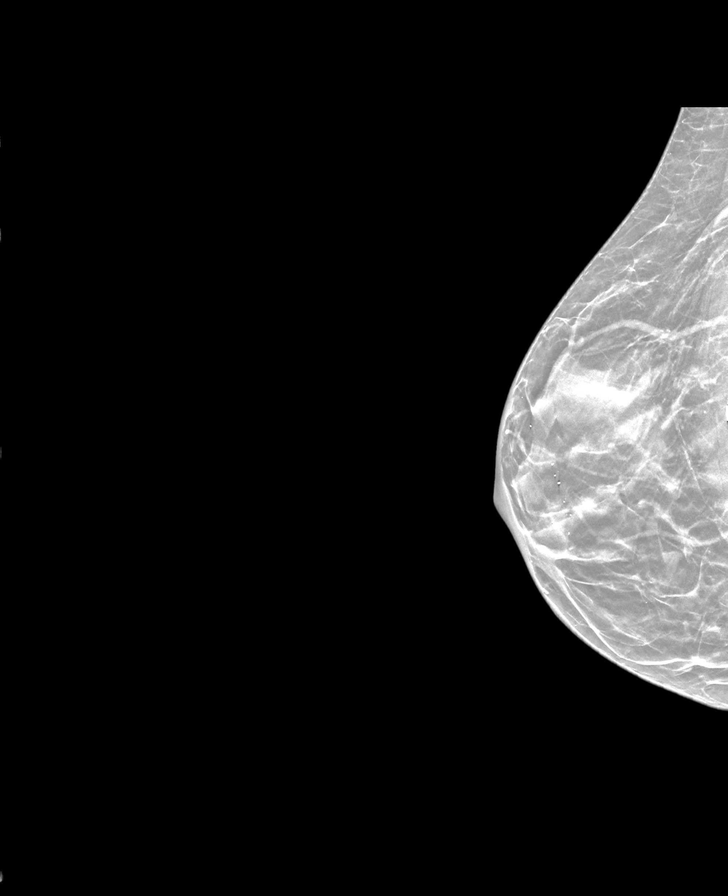

[L MLO synth-2D]
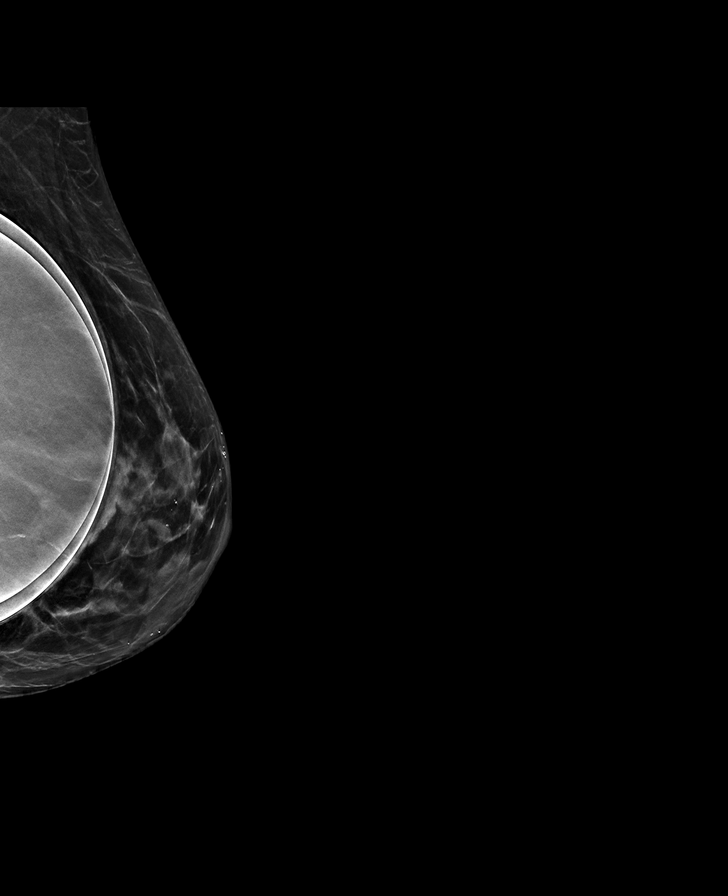

[8 of 40 positions shown; findings below may reference images not displayed]

ACR Breast Density Category c: The breast tissue is heterogeneously
dense, which may obscure small masses.
FINDINGS: There are no findings suspicious for malignancy. Images were
processed with CAD.
IMPRESSION: No mammographic evidence of malignancy. A result letter of this
screening mammogram will be mailed directly to the patient.

RECOMMENDATION:
Screening mammogram in one year. (Code:49-X-OQ9)

BI-RADS CATEGORY  1:  Negative.

## 2020-02-22 ENCOUNTER — Other Ambulatory Visit: Payer: Self-pay | Admitting: Obstetrics and Gynecology

## 2020-02-22 DIAGNOSIS — Z1231 Encounter for screening mammogram for malignant neoplasm of breast: Secondary | ICD-10-CM

## 2020-03-08 ENCOUNTER — Other Ambulatory Visit: Payer: Self-pay

## 2020-03-08 ENCOUNTER — Ambulatory Visit
Admission: RE | Admit: 2020-03-08 | Discharge: 2020-03-08 | Disposition: A | Discharge status: Home / Self Care | Payer: 59 | Source: Ambulatory Visit | Attending: Obstetrics and Gynecology | Admitting: Obstetrics and Gynecology

## 2020-03-08 DIAGNOSIS — Z1231 Encounter for screening mammogram for malignant neoplasm of breast: Secondary | ICD-10-CM

## 2021-02-09 ENCOUNTER — Other Ambulatory Visit: Payer: Self-pay | Admitting: Obstetrics and Gynecology

## 2021-02-09 DIAGNOSIS — Z1231 Encounter for screening mammogram for malignant neoplasm of breast: Secondary | ICD-10-CM

## 2021-03-14 ENCOUNTER — Ambulatory Visit
Admission: RE | Admit: 2021-03-14 | Discharge: 2021-03-14 | Disposition: A | Discharge status: Home / Self Care | Payer: 59 | Source: Ambulatory Visit | Attending: Obstetrics and Gynecology | Admitting: Obstetrics and Gynecology

## 2021-03-14 ENCOUNTER — Other Ambulatory Visit: Payer: Self-pay

## 2021-03-14 DIAGNOSIS — Z1231 Encounter for screening mammogram for malignant neoplasm of breast: Secondary | ICD-10-CM

## 2021-07-26 ENCOUNTER — Emergency Department (HOSPITAL_BASED_OUTPATIENT_CLINIC_OR_DEPARTMENT_OTHER): Payer: 59

## 2021-07-26 ENCOUNTER — Encounter (HOSPITAL_BASED_OUTPATIENT_CLINIC_OR_DEPARTMENT_OTHER): Payer: Self-pay

## 2021-07-26 ENCOUNTER — Emergency Department (HOSPITAL_BASED_OUTPATIENT_CLINIC_OR_DEPARTMENT_OTHER)
Admission: EM | Admit: 2021-07-26 | Discharge: 2021-07-26 | Disposition: A | Discharge status: Home / Self Care | Payer: 59 | Attending: Emergency Medicine | Admitting: Emergency Medicine

## 2021-07-26 ENCOUNTER — Other Ambulatory Visit: Payer: Self-pay

## 2021-07-26 DIAGNOSIS — R0789 Other chest pain: Secondary | ICD-10-CM | POA: Diagnosis present

## 2021-07-26 DIAGNOSIS — R200 Anesthesia of skin: Secondary | ICD-10-CM | POA: Insufficient documentation

## 2021-07-26 DIAGNOSIS — R079 Chest pain, unspecified: Secondary | ICD-10-CM

## 2021-07-26 DIAGNOSIS — Z7982 Long term (current) use of aspirin: Secondary | ICD-10-CM | POA: Insufficient documentation

## 2021-07-26 LAB — TROPONIN I (HIGH SENSITIVITY)
Troponin I (High Sensitivity): 3 ng/L (ref <18)
Troponin I (High Sensitivity): 3 ng/L (ref <18)

## 2021-07-26 LAB — HEPATIC FUNCTION PANEL
ALT: 19 U/L (ref 0–44)
AST: 24 U/L (ref 15–41)
Albumin: 4.7 g/dL (ref 3.5–5.0)
Alkaline Phosphatase: 76 U/L (ref 38–126)
Bilirubin, Direct: 0.1 mg/dL (ref 0.0–0.2)
Indirect Bilirubin: 0.4 mg/dL (ref 0.3–0.9)
Total Bilirubin: 0.5 mg/dL (ref 0.3–1.2)
Total Protein: 7.8 g/dL (ref 6.5–8.1)

## 2021-07-26 LAB — CBC
HCT: 41.9 % (ref 36.0–46.0)
Hemoglobin: 14.4 g/dL (ref 12.0–15.0)
MCH: 30.7 pg (ref 26.0–34.0)
MCHC: 34.4 g/dL (ref 30.0–36.0)
MCV: 89.3 fL (ref 80.0–100.0)
Platelets: 215 10*3/uL (ref 150–400)
RBC: 4.69 MIL/uL (ref 3.87–5.11)
RDW: 12 % (ref 11.5–15.5)
WBC: 6.2 10*3/uL (ref 4.0–10.5)
nRBC: 0 % (ref 0.0–0.2)

## 2021-07-26 LAB — BASIC METABOLIC PANEL
Anion gap: 8 (ref 5–15)
BUN: 16 mg/dL (ref 8–23)
CO2: 30 mmol/L (ref 22–32)
Calcium: 9.8 mg/dL (ref 8.9–10.3)
Chloride: 100 mmol/L (ref 98–111)
Creatinine, Ser: 0.82 mg/dL (ref 0.44–1.00)
GFR, Estimated: >60 mL/min (ref >60)
Glucose, Bld: 103 mg/dL — ABNORMAL HIGH (ref 70–99)
Potassium: 3.3 mmol/L — ABNORMAL LOW (ref 3.5–5.1)
Sodium: 138 mmol/L (ref 135–145)

## 2021-07-26 LAB — LIPASE, BLOOD: Lipase: 46 U/L (ref 11–51)

## 2021-07-26 NOTE — ED Notes (Signed)
Discharge instructions including recommended follow up care discussed with pt and s/o at bedside. Pt verbalized understanding with no questions at this time.

## 2021-07-26 NOTE — ED Provider Notes (Signed)
[facility] Provider Note   CSN: [id number] Arrival date & time: 07/26/21  1914     History  Chief Complaint  Patient presents with   Chest Pain    Anna Livingston is a 61 y.o. female.   Chest Pain Associated symptoms: no abdominal pain, no back pain, no shortness of breath and no weakness   Patient presents with chest pain.  Started yesterday.  Upper chest.  Dull.  Not exertional.  May be worse after eating.  Not worse lying back.  Felt even worse today.  States she does have occasionally some numbness on her left arm.  No nausea or vomiting.  No known cardiac history.  Has not had previous cardiac work-up.    Home Medications Prior to Admission medications   Medication Sig Start Date End Date Taking? Authorizing Provider  acetaminophen (TYLENOL) 325 MG tablet Take 650 mg by mouth every 6 (six) hours as needed.    [provider]  aspirin 81 MG tablet Take 81 mg by mouth daily.    [provider]  BIOTIN PO Take 1 tablet by mouth daily.    [provider]  Calcium Carbonate-Vitamin D (CALCIUM 600/VITAMIN D) 600-400 MG-UNIT per chew tablet Chew 2 tablets by mouth daily.    [provider]  ciprofloxacin (CIPRO) 500 MG tablet Take 500 mg by mouth 2 (two) times daily.    [provider]  estradiol (ESTRACE) 0.5 MG tablet Take 0.5 mg by mouth every morning.    [provider]  ibuprofen (ADVIL,MOTRIN) 200 MG tablet Take 200 mg by mouth every 6 (six) hours as needed.    [provider]  Meth-Hyo-M Bl-Na Phos-Ph Sal (URIBEL) 118 MG CAPS Take 1 capsule (118 mg total) by mouth 3 (three) times daily as needed (dysuria). 04/28/12   Festus Aloe, MD  Multiple Vitamin (MULTIVITAMIN) tablet Take 1 tablet by mouth daily.    [provider]  phenazopyridine (PYRIDIUM) 100 MG tablet Take 100 mg by mouth 3 (three) times daily as needed for pain.    [provider]      Allergies     Patient has no known allergies.    Review of Systems   Review of Systems  Constitutional:  Negative for appetite change.  Respiratory:  Negative for shortness of breath.   Cardiovascular:  Positive for chest pain.  Gastrointestinal:  Negative for abdominal pain.  Genitourinary:  Negative for dysuria.  Musculoskeletal:  Negative for back pain.  Neurological:  Negative for weakness.   Physical Exam Updated Vital Signs BP 124/79    Pulse 72    Temp 98.4 F (36.9 C) (Oral)    Resp (!) 21    Ht 5\' 2"  (1.575 m)    Wt 59.4 kg    SpO2 97%    BMI 23.96 kg/m  Physical Exam Vitals and nursing note reviewed.  Cardiovascular:     Rate and Rhythm: Regular rhythm.  Pulmonary:     Breath sounds: No wheezing or rhonchi.  Chest:     Chest wall: No tenderness.  Musculoskeletal:     Right lower leg: No edema.     Left lower leg: No edema.  Skin:    General: Skin is warm.  Neurological:     Mental Status: She is alert.    ED Results / Procedures / Treatments   Labs (all labs ordered are listed, but only abnormal results are displayed) Labs Reviewed  BASIC METABOLIC PANEL -  Abnormal; Notable for the following components:      Result Value   Potassium 3.3 (*)    Glucose, Bld 103 (*)    All other components within normal limits  CBC  HEPATIC FUNCTION PANEL  LIPASE, BLOOD  TROPONIN I (HIGH SENSITIVITY)  TROPONIN I (HIGH SENSITIVITY)    EKG EKG Interpretation  Date/Time:  Thursday July 26 2021 19:24:37 EST Ventricular Rate:  81 PR Interval:  143 QRS Duration: 91 QT Interval:  381 QTC Calculation: 443 R Axis:   -22 Text Interpretation: Sinus rhythm Borderline left axis deviation Probable anteroseptal infarct, old No significant change since last tracing Confirmed by Davonna Belling [id number]) on 07/26/2021 7:26:50 PM  Radiology DG Chest 2 View  Result Date: 07/26/2021 CLINICAL DATA:  Chest pain. EXAM: CHEST - 2 VIEW COMPARISON:  None. FINDINGS: The cardiomediastinal  contours are normal. The lungs are clear. Pulmonary vasculature is normal. No consolidation, pleural effusion, or pneumothorax. No acute osseous abnormalities are seen. IMPRESSION: No acute chest findings or explanation for chest pain. Electronically Signed   By: Keith Rake M.D.   On: 07/26/2021 19:42    Procedures Procedures    Medications Ordered in ED Medications - No data to display  ED Course/ Medical Decision Making/ A&P                           Medical Decision Making Problems Addressed: Nonspecific chest pain: acute illness or injury  Amount and/or Complexity of Data Reviewed Labs: ordered. Radiology: ordered and independent interpretation performed. Decision-making details documented in ED Course. ECG/medicine tests: independent interpretation performed. Decision-making details documented in ED Course.   Patient presents with chest pain.  Anterior chest.  Initial differential gnosis long and includes life-threatening conditions such as coronary artery disease/ACS. Initial EKG reassuring.  Interpreted by me.  Chest x-ray also independently interpreted.  Overall low risk for cardiac etiology.  Not exertional.  Worse after eating.  No known cardiac history.  Chest x-ray did not show other cause such as pneumothorax or pneumonia.  No cough.  Doubt pulm embolism.  Do not feels fine need a CT scan.  Patient will be discharged home with outpatient follow-up as needed.        Final Clinical Impression(s) / ED Diagnoses Final diagnoses:  Nonspecific chest pain    Rx / DC Orders ED Discharge Orders     None         Davonna Belling, MD 07/26/21 2246

## 2021-07-26 NOTE — ED Notes (Signed)
Patient transported to X-ray 

## 2021-07-26 NOTE — ED Triage Notes (Signed)
Pt c/o CP started yesterday-NAD-steady gait

## 2021-07-26 NOTE — ED Notes (Signed)
Pt returned from xray

## 2022-02-15 ENCOUNTER — Other Ambulatory Visit: Payer: Self-pay | Admitting: Obstetrics and Gynecology

## 2022-02-15 DIAGNOSIS — Z139 Encounter for screening, unspecified: Secondary | ICD-10-CM

## 2022-03-15 ENCOUNTER — Ambulatory Visit: Payer: 59

## 2022-04-23 ENCOUNTER — Ambulatory Visit
Admission: RE | Admit: 2022-04-23 | Discharge: 2022-04-23 | Disposition: A | Discharge status: Home / Self Care | Payer: 59 | Source: Ambulatory Visit | Attending: Obstetrics and Gynecology | Admitting: Obstetrics and Gynecology

## 2022-04-23 ENCOUNTER — Other Ambulatory Visit: Payer: Self-pay | Admitting: Obstetrics and Gynecology

## 2022-04-23 DIAGNOSIS — Z139 Encounter for screening, unspecified: Secondary | ICD-10-CM

## 2023-03-24 ENCOUNTER — Other Ambulatory Visit: Payer: Self-pay | Admitting: Obstetrics and Gynecology

## 2023-03-24 DIAGNOSIS — Z1231 Encounter for screening mammogram for malignant neoplasm of breast: Secondary | ICD-10-CM

## 2023-04-25 ENCOUNTER — Ambulatory Visit
Admission: RE | Admit: 2023-04-25 | Discharge: 2023-04-25 | Disposition: A | Discharge status: Home / Self Care | Payer: 59 | Source: Ambulatory Visit | Attending: Obstetrics and Gynecology | Admitting: Obstetrics and Gynecology

## 2023-04-25 DIAGNOSIS — Z1231 Encounter for screening mammogram for malignant neoplasm of breast: Secondary | ICD-10-CM

## 2024-03-24 ENCOUNTER — Other Ambulatory Visit: Payer: Self-pay | Admitting: Obstetrics and Gynecology

## 2024-03-24 DIAGNOSIS — Z1231 Encounter for screening mammogram for malignant neoplasm of breast: Secondary | ICD-10-CM

## 2024-05-11 ENCOUNTER — Ambulatory Visit
Admission: RE | Admit: 2024-05-11 | Discharge: 2024-05-11 | Disposition: A | Source: Ambulatory Visit | Attending: Obstetrics and Gynecology | Admitting: Obstetrics and Gynecology

## 2024-05-11 DIAGNOSIS — Z1231 Encounter for screening mammogram for malignant neoplasm of breast: Secondary | ICD-10-CM
